# Patient Record
Sex: Female | Born: 1985 | Race: White | Hispanic: No | Marital: Single | Smoking: Never smoker
Health system: Southern US, Community
[De-identification: ages and names within clinical notes are randomized; demographics above are authoritative.]

---

## 2014-09-02 ENCOUNTER — Emergency Department (HOSPITAL_COMMUNITY): Payer: Self-pay

## 2014-09-02 ENCOUNTER — Emergency Department (HOSPITAL_COMMUNITY)
Admission: EM | Admit: 2014-09-02 | Discharge: 2014-09-02 | Disposition: A | Discharge status: Home / Self Care | Payer: Self-pay | Attending: Emergency Medicine | Admitting: Emergency Medicine

## 2014-09-02 ENCOUNTER — Encounter (HOSPITAL_COMMUNITY): Payer: Self-pay | Admitting: Emergency Medicine

## 2014-09-02 DIAGNOSIS — Z3202 Encounter for pregnancy test, result negative: Secondary | ICD-10-CM | POA: Insufficient documentation

## 2014-09-02 DIAGNOSIS — N23 Unspecified renal colic: Secondary | ICD-10-CM | POA: Insufficient documentation

## 2014-09-02 DIAGNOSIS — R109 Unspecified abdominal pain: Secondary | ICD-10-CM | POA: Insufficient documentation

## 2014-09-02 LAB — URINALYSIS, ROUTINE W REFLEX MICROSCOPIC
Bilirubin Urine: NEGATIVE
GLUCOSE, UA: NEGATIVE mg/dL
KETONES UR: NEGATIVE mg/dL
LEUKOCYTES UA: NEGATIVE
NITRITE: NEGATIVE
PH: 5 (ref 5.0–8.0)
Protein, ur: NEGATIVE mg/dL
SPECIFIC GRAVITY, URINE: 1.015 (ref 1.005–1.030)
Urobilinogen, UA: 0.2 mg/dL (ref 0.0–1.0)

## 2014-09-02 LAB — URINE MICROSCOPIC-ADD ON

## 2014-09-02 LAB — POC URINE PREG, ED: Preg Test, Ur: NEGATIVE

## 2014-09-02 MED ORDER — HYDROCODONE-ACETAMINOPHEN 5-325 MG PO TABS: 1.0000 | ORAL_TABLET | ORAL | Status: AC | PRN

## 2014-09-02 NOTE — ED Notes (Addendum)
Sudden onset L flank pain this morning.  Pain worse w/movement and radiates to lower abdomen.  Denies dysuria, burning w/urination, odor or blood in urine, n/v/d.  Strong family hx of multiple renal stones.

## 2014-09-02 NOTE — ED Notes (Signed)
Pt co left flank pain that radiates into groin, pain started this morning.

## 2014-09-02 NOTE — Discharge Instructions (Signed)

## 2014-09-02 NOTE — ED Provider Notes (Signed)
CSN: [id number]     Arrival date & time 09/02/14  1256 History   First MD Initiated Contact with Patient 09/02/14 1343     This chart was scribed for Gilda Crease, * by Tonye Royalty, ED Scribe. This patient was seen in room [facility] and the patient's care was started at 1:44 PM.   Chief Complaint  Patient presents with  . Flank Pain    lt   The history is provided by the patient. No language interpreter was used.    HPI Comments: Heather Dyer is a 28 y.o. female who presents to the Emergency Department complaining of left sided flank pain with onset this morning some time after waking. She denies having similar symptoms previously. She states pain is worse with movement or walking. She denies dysuria, hematuria, frequency, increased or decreased urination, nausea, vomiting, or fever. She states she is not on her period at this time. She reports a family history of kidney stones.  History reviewed. No pertinent past medical history. History reviewed. No pertinent past surgical history. History reviewed. No pertinent family history. History  Substance Use Topics  . Smoking status: Never Smoker   . Smokeless tobacco: Not on file  . Alcohol Use: No   OB History   Grav Para Term Preterm Abortions TAB SAB Ect Mult Living                 Review of Systems  Gastrointestinal: Negative for nausea and vomiting.  Genitourinary: Positive for flank pain. Negative for dysuria, urgency, frequency, hematuria, decreased urine volume and difficulty urinating.  All other systems reviewed and are negative.     Allergies  Review of patient's allergies indicates no known allergies.  Home Medications   Prior to Admission medications   Not on File   BP 148/83  Pulse 72  Temp(Src) 98 F (36.7 C) (Oral)  Resp 18  Ht  (1.6 m)  Wt 247 lb 3 oz (112.124 kg)  BMI 43.80 kg/m2  SpO2 98%  LMP 08/28/2014 Physical Exam  Nursing note and vitals reviewed. Constitutional: She is  oriented to person, place, and time. She appears well-developed and well-nourished. No distress.  HENT:  Head: Normocephalic and atraumatic.  Right Ear: Hearing normal.  Left Ear: Hearing normal.  Nose: Nose normal.  Mouth/Throat: Oropharynx is clear and moist and mucous membranes are normal.  Eyes: Conjunctivae and EOM are normal. Pupils are equal, round, and reactive to light.  Neck: Normal range of motion. Neck supple.  Cardiovascular: Regular rhythm, S1 normal and S2 normal.  Exam reveals no gallop and no friction rub.   No murmur heard. Pulmonary/Chest: Effort normal and breath sounds normal. No respiratory distress. She exhibits no tenderness.  Abdominal: Soft. Normal appearance and bowel sounds are normal. There is no hepatosplenomegaly. There is no tenderness. There is no rebound, no guarding, no tenderness at McBurney's point and negative Murphy's sign. No hernia.  Genitourinary:  Tender to left lateral lumbar region  Musculoskeletal: Normal range of motion.  Neurological: She is alert and oriented to person, place, and time. She has normal strength. No cranial nerve deficit or sensory deficit. Coordination normal. GCS eye subscore is 4. GCS verbal subscore is 5. GCS motor subscore is 6.  Skin: Skin is warm, dry and intact. No rash noted. No cyanosis.  Psychiatric: She has a normal mood and affect. Her speech is normal and behavior is normal. Thought content normal.    ED Course  Procedures (including critical care  time) Labs Review Labs Reviewed  URINALYSIS, ROUTINE W REFLEX MICROSCOPIC - Abnormal; Notable for the following:    Hgb urine dipstick LARGE (*)    All other components within normal limits  URINE MICROSCOPIC-ADD ON - Abnormal; Notable for the following:    Squamous Epithelial / LPF MANY (*)    Bacteria, UA FEW (*)    All other components within normal limits  POC URINE PREG, ED    Imaging Review No results found.   EKG Interpretation None     DIAGNOSTIC  STUDIES: Oxygen Saturation is 98% on room air, normal by my interpretation.    COORDINATION OF CARE:    MDM   Final diagnoses:  None   renal colic  Patient presents to the ER for evaluation of left flank pain. Patient reports constant pain in the left flank area, radiating to the groin. She has never had similar symptoms. She has not noticed any urinary symptoms. The patient's urinalysis did show white blood cells, red blood cells. A CT scan was performed to further evaluate. There is a small stone in the bladder, likely recently passed. She had slight hydronephrosis on the left side. Negative nitrite, negative leukocytes on UA, white cells likely secondary to stone. Will send culture. No further intervention is necessary, patient likely will experience pain relief now that she has passed a stone. We'll provide a limited amount of pain medication to be used as needed. Return if symptoms worsen.  I personally performed the services described in this documentation, which was scribed in my presence. The recorded information has been reviewed and is accurate.     Gilda Crease, MD 09/02/14 1520

## 2014-09-02 NOTE — ED Notes (Signed)
Patient with no complaints at this time. Respirations even and unlabored. Skin warm/dry. Discharge instructions reviewed with patient at this time. Patient given opportunity to voice concerns/ask questions. Patient discharged at this time and left Emergency Department with steady gait.   

## 2015-08-24 IMAGING — CT CT ABD-PELV W/O CM
2 of 4 series · 16 of 46 positions shown, 18 images · non-contrast
Comparison: None

CLINICAL DATA: LEFT flank pain since 3533 hr question kidney stone

EXAM:
CT ABDOMEN AND PELVIS WITHOUT CONTRAST
TECHNIQUE: Multidetector CT imaging of the abdomen and pelvis was performed
following the standard protocol without IV contrast. Sagittal and
coronal MPR images reconstructed from axial data set. Oral contrast
not administered for this indication.

[Series 2: standard/full over (age)lbs 5.0 · axial · 0.77mm/px · z∈[-426,-6]mm · 13 of 92 slices shown, 15 images]
[im 4/92  soft-tissue]
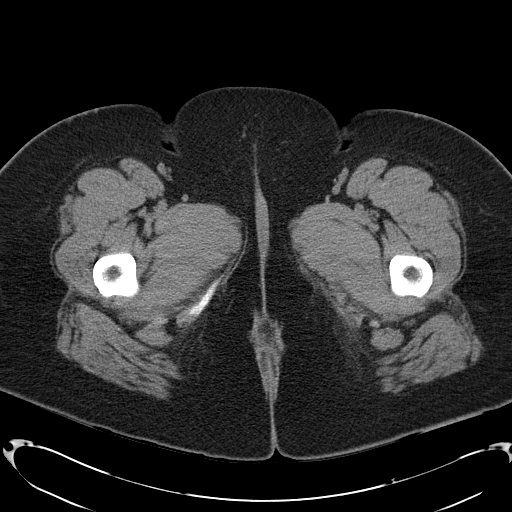
[im 4/92  bone]
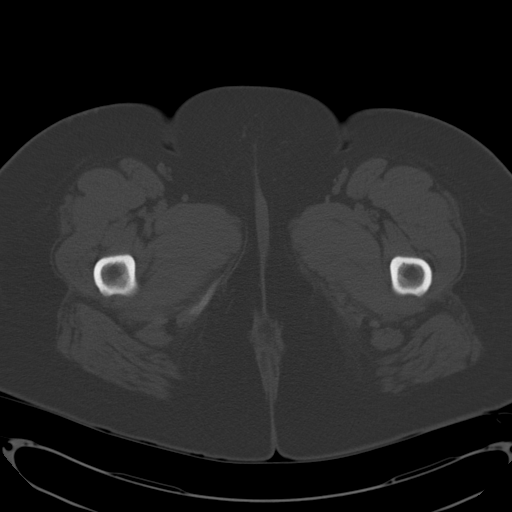
[im 12/92  soft-tissue]
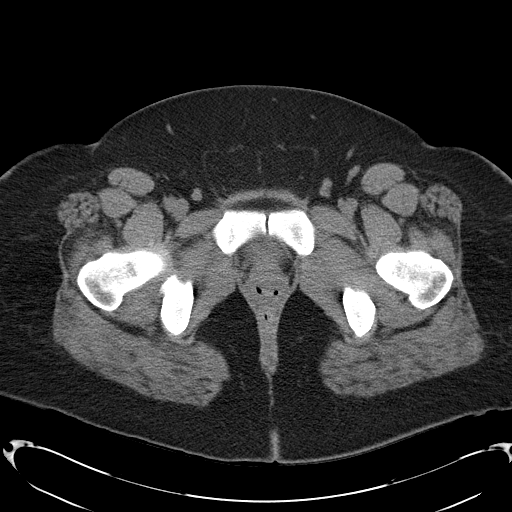
[im 19/92  soft-tissue]
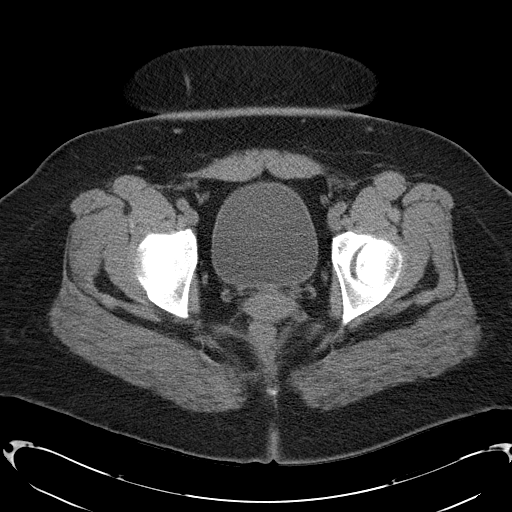
[im 27/92  soft-tissue]
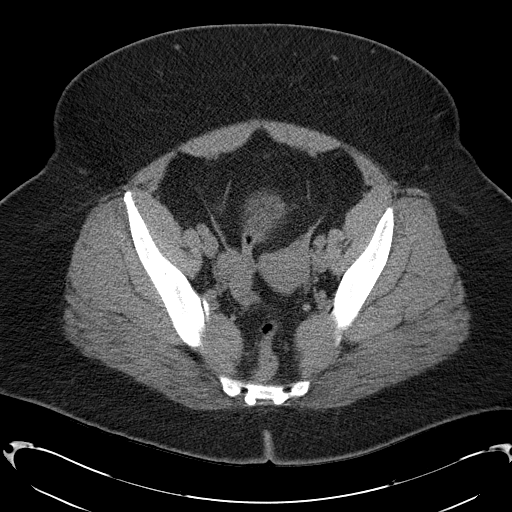
[im 31/92  soft-tissue]
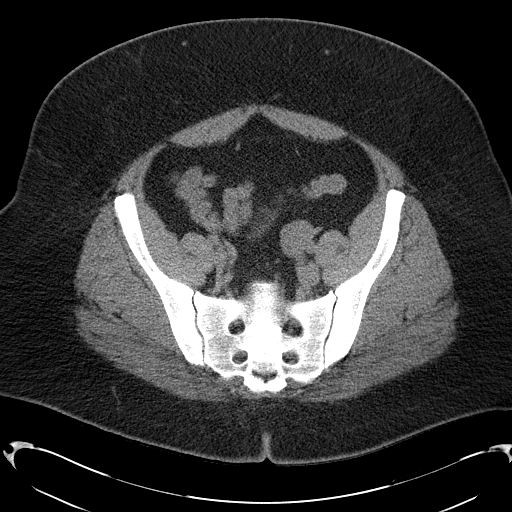
[im 38/92  soft-tissue]
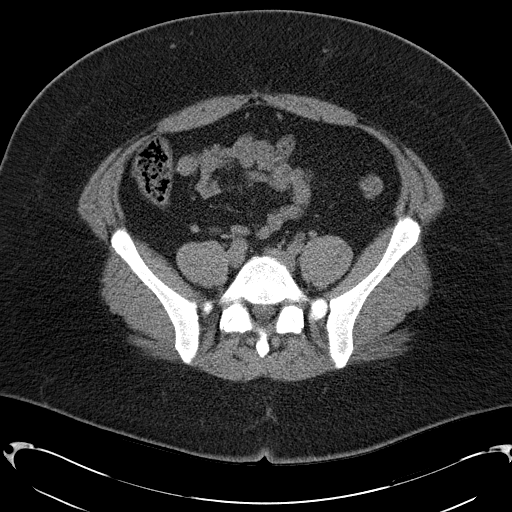
[im 46/92  soft-tissue]
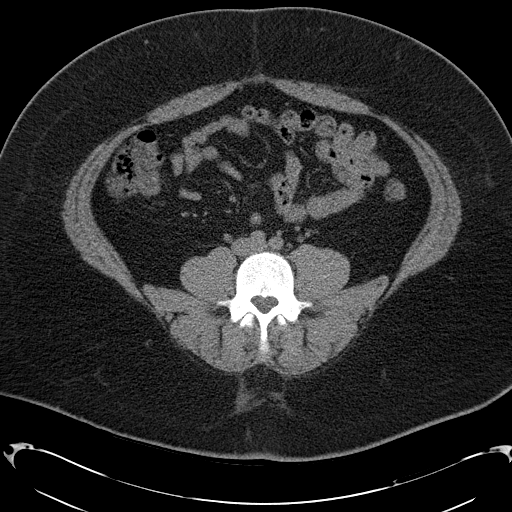
[im 54/92  soft-tissue]
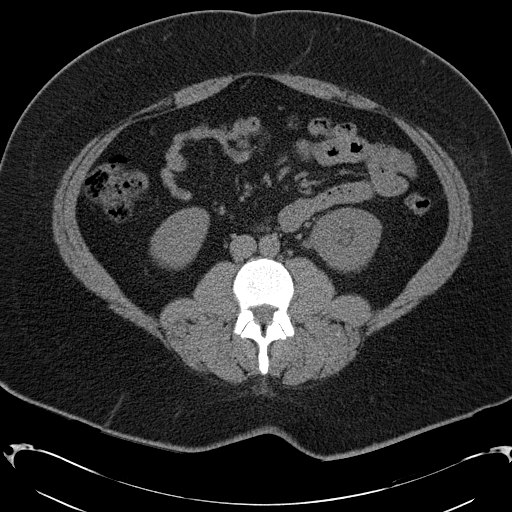
[im 61/92  soft-tissue]
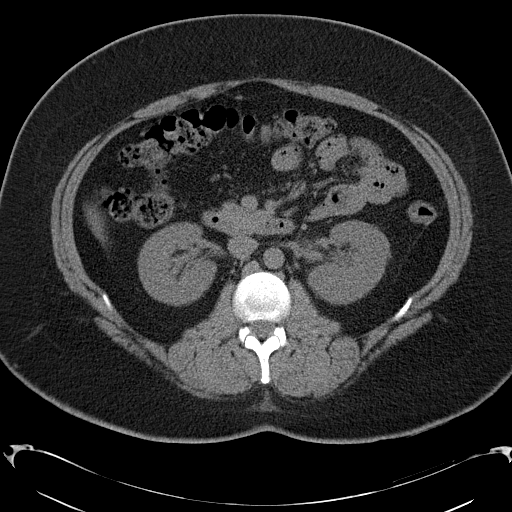
[im 61/92  bone]
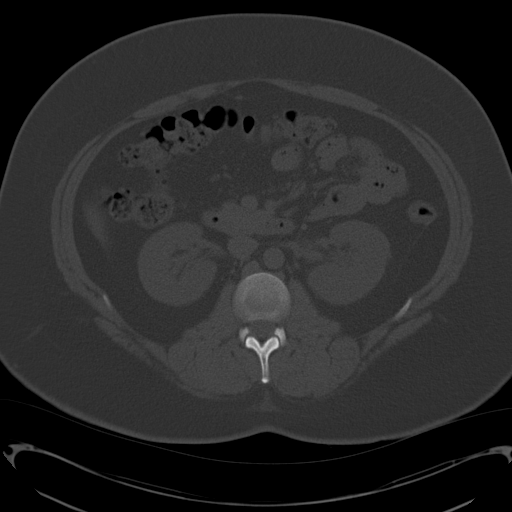
[im 65/92  soft-tissue]
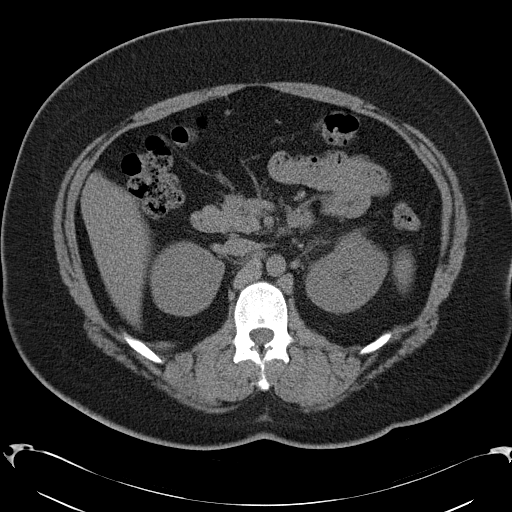
[im 73/92  soft-tissue]
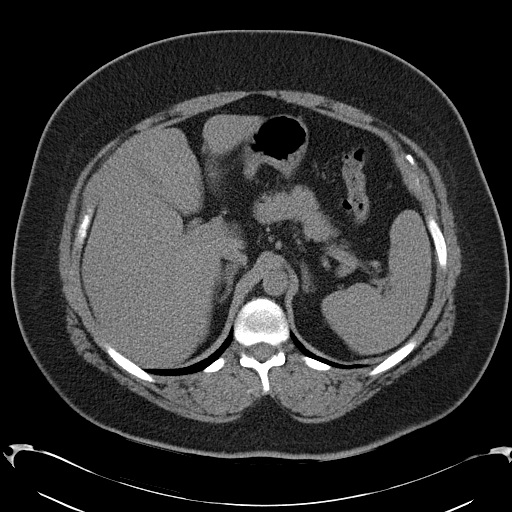
[im 80/92  soft-tissue]
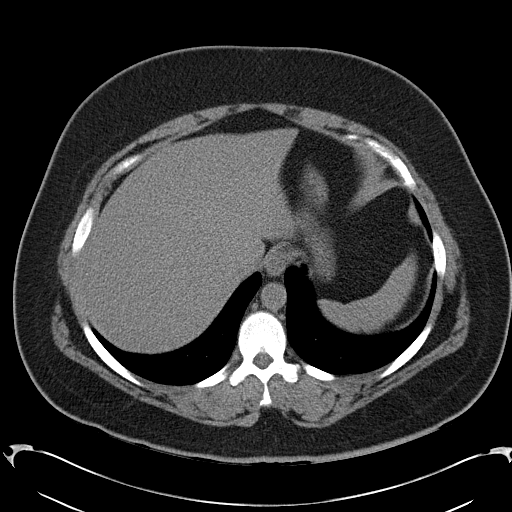
[im 88/92  soft-tissue]
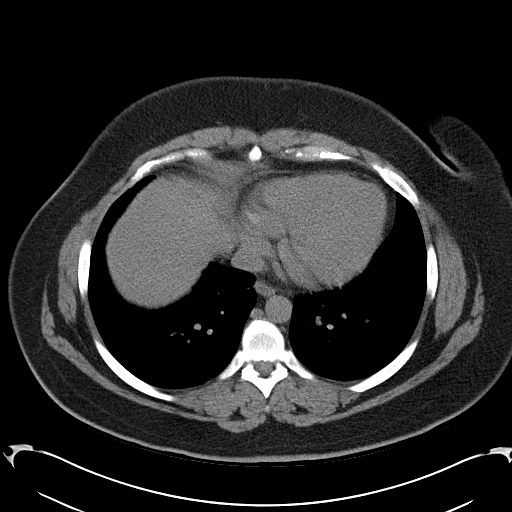

[Series 4: mpr coronal · coronal · 0.71mm/px · 3 of 105 slices shown]
[im 35/105  soft-tissue]
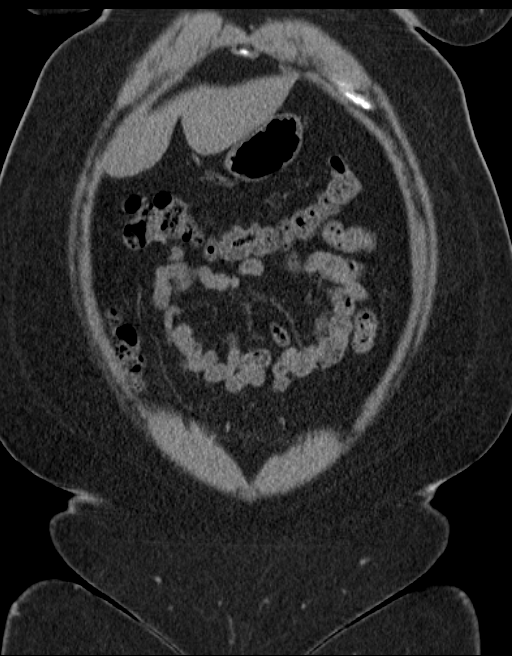
[im 47/105  soft-tissue]
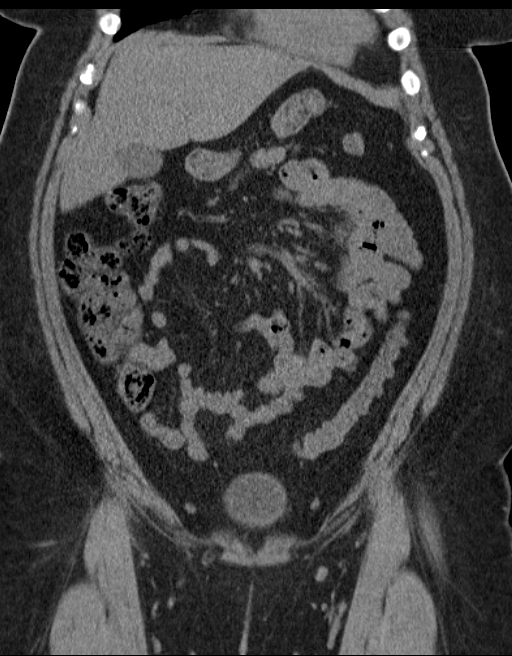
[im 58/105  soft-tissue]
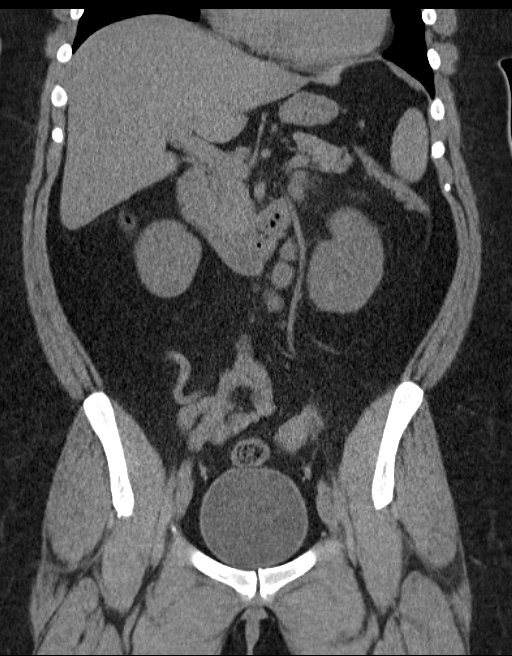

[16 of 46 positions shown; findings below may reference images not displayed]

FINDINGS: Lung bases clear.

Mild LEFT hydronephrosis and minimal LEFT ureteral dilatation.

No ureteral calcifications seen.

Tiny 1-2 mm diameter calculus identified within urinary bladder
likely a passed calculus.

Additional tiny nonobstructing calculus at inferior pole RIGHT
kidney.

Low-attenuation LEFT adrenal mass 2.5 x 2.5 cm image 25 consistent
with adrenal adenoma.

Within limits of a nonenhanced exam no additional focal
abnormalities of the liver, spleen, pancreas, kidneys or adrenal
glands.

Normal appendix.

Stomach and bowel loops normal for technique.

No focal uterine or adnexal abnormalities.

No mass, adenopathy, free fluid, hernia, or inflammatory process.

Few scattered probable bone islands.
IMPRESSION: Mild LEFT hydronephrosis and minimal hydroureter likely due to a 1-2
mm diameter passed calculus within urinary bladder.

Additional tiny nonobstructing RIGHT renal calculus.

Low-attenuation 2.5 cm LEFT adrenal mass consistent with adenoma.
